# Patient Record
Sex: Male | Born: 1975 | Race: White | Hispanic: No | Marital: Single | Smoking: Never smoker
Health system: Southern US, Community
[De-identification: ages and names within clinical notes are randomized; demographics above are authoritative.]

## PROBLEM LIST (undated history)

## (undated) DIAGNOSIS — J302 Other seasonal allergic rhinitis: Secondary | ICD-10-CM

## (undated) HISTORY — PX: KNEE SURGERY: SHX244

## (undated) HISTORY — PX: OTHER SURGICAL HISTORY: SHX169

---

## 2014-01-11 ENCOUNTER — Encounter (HOSPITAL_BASED_OUTPATIENT_CLINIC_OR_DEPARTMENT_OTHER): Payer: Self-pay | Admitting: Emergency Medicine

## 2014-01-11 ENCOUNTER — Emergency Department (HOSPITAL_BASED_OUTPATIENT_CLINIC_OR_DEPARTMENT_OTHER)
Admission: EM | Admit: 2014-01-11 | Discharge: 2014-01-11 | Disposition: A | Discharge status: Home / Self Care | Payer: Self-pay | Attending: Emergency Medicine | Admitting: Emergency Medicine

## 2014-01-11 DIAGNOSIS — Z79899 Other long term (current) drug therapy: Secondary | ICD-10-CM | POA: Insufficient documentation

## 2014-01-11 DIAGNOSIS — J302 Other seasonal allergic rhinitis: Secondary | ICD-10-CM

## 2014-01-11 DIAGNOSIS — J309 Allergic rhinitis, unspecified: Secondary | ICD-10-CM | POA: Insufficient documentation

## 2014-01-11 HISTORY — DX: Other seasonal allergic rhinitis: J30.2

## 2014-01-11 MED ORDER — METHYLPREDNISOLONE ACETATE 80 MG/ML IJ SUSP
80.0000 mg | Freq: Once | INTRAMUSCULAR | Status: AC
  Administered 2014-01-11: 80 mg via INTRAMUSCULAR
  Filled 2014-01-11: qty 1

## 2014-01-11 MED ORDER — OXYMETAZOLINE HCL 0.05 % NA SOLN
1.0000 | Freq: Two times a day (BID) | NASAL | Status: DC | PRN
  Administered 2014-01-11: 1 via NASAL
  Filled 2014-01-11: qty 15

## 2014-01-11 NOTE — ED Provider Notes (Signed)
CSN: [id number]     Arrival date & time 01/11/14  [id number] History   First MD Initiated Contact with Patient 01/11/14 0541     Chief Complaint  Patient presents with  . Allergies     (Consider location/radiation/quality/duration/timing/severity/associated sxs/prior Treatment) HPI This is a 38 year old male with a history of seasonal allergies. He is here with exacerbation of his allergies for about the last 10 days. His principal complaint is nasal congestion which he says is severe enough that he "can't breathe" through his nose. He has also been having a cough for the last day and has had itching eyes. He has been taking Claritin without relief. He is requesting a shot as he had relief last year after getting a shot from his PCP. He is not sure this was a steroid.  Past Medical History  Diagnosis Date  . Seasonal allergies    Past Surgical History  Procedure Laterality Date  . Arm surgery     No family history on file. History  Substance Use Topics  . Smoking status: Never Smoker   . Smokeless tobacco: Not on file  . Alcohol Use: No    Review of Systems  All other systems reviewed and are negative.  Allergies  Review of patient's allergies indicates not on file.  Home Medications   Current Outpatient Rx  Name  Route  Sig  Dispense  Refill  . loratadine (CLARITIN) 10 MG tablet   Oral   Take 10 mg by mouth daily.          BP 127/63  Pulse 75  Temp(Src) 97.7 F (36.5 C) (Oral)  Resp 20  Ht 5\' 10"  (1.778 m)  Wt 175 lb (79.379 kg)  BMI 25.11 kg/m2  SpO2 96%  Physical Exam General: Well-developed, well-nourished male in no acute distress; appearance consistent with age of record HENT: normocephalic; atraumatic; nasal congestion Eyes: pupils equal, round and reactive to light; extraocular muscles intact Neck: supple Heart: regular rate and rhythm Lungs: clear to auscultation bilaterally Abdomen: soft; nondistended Extremities: No deformity; full range of motion;  pulses normal Neurologic: Awake, alert and oriented; motor function intact in all extremities and symmetric; no facial droop Skin: Warm and dry Psychiatric: Normal mood and affect    ED Course  Procedures (including critical care time)   MDM  We will treat with IM Depo-Medrol. The patient was also advised that an over-the-counter nasal steroid may be beneficial in the long run.    Hanley SeamenJohn L Deland Slocumb, MD 01/11/14 [phone]

## 2014-01-11 NOTE — ED Notes (Signed)
MD at bedside. 

## 2014-01-11 NOTE — ED Notes (Signed)
"  need a shot for my allergies", denies fever

## 2014-01-11 NOTE — Discharge Instructions (Signed)
Allergic Rhinitis Allergic rhinitis is when the mucous membranes in the nose respond to allergens. Allergens are particles in the air that cause your body to have an allergic reaction. This causes you to release allergic antibodies. Through a chain of events, these eventually cause you to release histamine into the blood stream. Although meant to protect the body, it is this release of histamine that causes your discomfort, such as frequent sneezing, congestion, and an itchy, runny nose.  CAUSES  Seasonal allergic rhinitis (hay fever) is caused by pollen allergens that may come from grasses, trees, and weeds. Year-round allergic rhinitis (perennial allergic rhinitis) is caused by allergens such as house dust mites, pet dander, and mold spores.  SYMPTOMS   Nasal stuffiness (congestion).  Itchy, runny nose with sneezing and tearing of the eyes. DIAGNOSIS  Your health care provider can help you determine the allergen or allergens that trigger your symptoms. If you and your health care provider are unable to determine the allergen, skin or blood testing may be used. TREATMENT  Allergic Rhinitis does not have a cure, but it can be controlled by:  Medicines and allergy shots (immunotherapy).  Avoiding the allergen. Hay fever may often be treated with antihistamines in pill or nasal spray forms. Antihistamines block the effects of histamine. There are over-the-counter medicines that may help with nasal congestion and swelling around the eyes. Check with your health care provider before taking or giving this medicine.  If avoiding the allergen or the medicine prescribed do not work, there are many new medicines your health care provider can prescribe. Stronger medicine may be used if initial measures are ineffective. Desensitizing injections can be used if medicine and avoidance does not work. Desensitization is when a patient is given ongoing shots until the body becomes less sensitive to the allergen.  Make sure you follow up with your health care provider if problems continue. HOME CARE INSTRUCTIONS It is not possible to completely avoid allergens, but you can reduce your symptoms by taking steps to limit your exposure to them. It helps to know exactly what you are allergic to so that you can avoid your specific triggers. SEEK MEDICAL CARE IF:   You have a fever.  You develop a cough that does not stop easily (persistent).  You have shortness of breath.  You start wheezing.  Symptoms interfere with normal daily activities. Document Released: 06/11/2001 Document Revised: 07/07/2013 Document Reviewed: 05/24/2013 [employer] Patient Information 2014 [employer].  

## 2015-12-21 ENCOUNTER — Emergency Department (HOSPITAL_BASED_OUTPATIENT_CLINIC_OR_DEPARTMENT_OTHER): Payer: Managed Care, Other (non HMO)

## 2015-12-21 ENCOUNTER — Encounter (HOSPITAL_BASED_OUTPATIENT_CLINIC_OR_DEPARTMENT_OTHER): Payer: Self-pay | Admitting: *Deleted

## 2015-12-21 ENCOUNTER — Emergency Department (HOSPITAL_BASED_OUTPATIENT_CLINIC_OR_DEPARTMENT_OTHER)
Admission: EM | Admit: 2015-12-21 | Discharge: 2015-12-21 | Disposition: A | Discharge status: Home / Self Care | Payer: Managed Care, Other (non HMO) | Attending: Emergency Medicine | Admitting: Emergency Medicine

## 2015-12-21 DIAGNOSIS — M79661 Pain in right lower leg: Secondary | ICD-10-CM | POA: Diagnosis not present

## 2015-12-21 DIAGNOSIS — Z8709 Personal history of other diseases of the respiratory system: Secondary | ICD-10-CM | POA: Insufficient documentation

## 2015-12-21 MED ORDER — ORPHENADRINE CITRATE ER 100 MG PO TB12: 100.0000 mg | ORAL_TABLET | Freq: Two times a day (BID) | ORAL | Status: AC

## 2015-12-21 MED ORDER — IBUPROFEN 600 MG PO TABS: 600.0000 mg | ORAL_TABLET | Freq: Four times a day (QID) | ORAL | Status: AC | PRN

## 2015-12-21 NOTE — ED Notes (Signed)
Pt transported to [facility]

## 2015-12-21 NOTE — ED Notes (Signed)
Pain in his right lower leg for a week. No injury.

## 2015-12-21 NOTE — ED Provider Notes (Signed)
CSN: [id number]     Arrival date & time 12/21/15  1454 History   First MD Initiated Contact with Patient 12/21/15 1610     Chief Complaint  Patient presents with  . Leg Pain     (Consider location/radiation/quality/duration/timing/severity/associated sxs/prior Treatment) HPI Patient has had pain in his right calf for about a week. He has not noted any swelling or redness. It is worse with stretching the calf and weightbearing. Patient had an arthroscopic right knee surgery about 3 months ago. He denies he's having any problems with the knee itself. He contacted his orthopedic doctor and he advised to get assessed for a blood clot in the leg. Patient's family history is also positive for factor V Leiden deficiency with first-degree relatives with history of DVT. Patient denies any injury. He reports he does do a lot of traveling in his car. Past Medical History  Diagnosis Date  . Seasonal allergies    Past Surgical History  Procedure Laterality Date  . Arm surgery    . Knee surgery     No family history on file. Social History  Substance Use Topics  . Smoking status: Never Smoker   . Smokeless tobacco: None  . Alcohol Use: No    Review of Systems  Constitutional: No general illness, fatigue or fever. Respiratory: No dyspnea or cough. Cardiovascular: No chest pain, syncope  Allergies  Review of patient's allergies indicates no known allergies.  Home Medications   Prior to Admission medications   Medication Sig Start Date End Date Taking? Authorizing Provider  ibuprofen (ADVIL,MOTRIN) 600 MG tablet Take 1 tablet (600 mg total) by mouth every 6 (six) hours as needed. 12/21/15   Arby Barrette, MD  loratadine (CLARITIN) 10 MG tablet Take 10 mg by mouth daily.    Historical Provider, MD   BP 122/78 mmHg  Pulse 65  Temp(Src) 98.7 F (37.1 C) (Oral)  Resp 18  Ht  (1.778 m)  Wt 180 lb (81.647 kg)  BMI 25.83 kg/m2  SpO2 95% Physical Exam  Constitutional: He is  oriented to person, place, and time. He appears well-developed and well-nourished. No distress.  HENT:  Head: Normocephalic and atraumatic.  Eyes: EOM are normal.  Cardiovascular: Normal rate, regular rhythm, normal heart sounds and intact distal pulses.   Pulmonary/Chest: Effort normal and breath sounds normal.  Musculoskeletal: Normal range of motion. He exhibits no edema.  Some reproducible tenderness to deep palpation within the main body of the gastrocnemius on the right. No appreciable swelling. Tenderness reproduced by plantar flexion. No effusion or erythema at the knee. No pain at the joint line.  Neurological: He is alert and oriented to person, place, and time. He has normal strength. He exhibits normal muscle tone. Coordination normal. GCS eye subscore is 4. GCS verbal subscore is 5. GCS motor subscore is 6.  Skin: Skin is warm, dry and intact.  Psychiatric: He has a normal mood and affect.    ED Course  Procedures (including critical care time) Labs Review Labs Reviewed - No data to display  Imaging Review US Venous Img Lower Unilateral Right  12/21/2015  CLINICAL DATA:  Right calf pain for 1 week. 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 EXAM: Right LOWER EXTREMITY VENOUS DOPPLER ULTRASOUND TECHNIQUE: Gray-scale sonography with graded compression, as well as color Doppler and duplex ultrasound were performed to evaluate the lower extremity deep venous systems from the level of the common femoral vein and including the common femoral, femoral, profunda  femoral, popliteal and calf veins including the posterior tibial, peroneal and gastrocnemius veins when visible. The superficial great saphenous vein was also interrogated. Spectral Doppler was utilized to evaluate flow at rest and with distal augmentation maneuvers in the common femoral, femoral and popliteal veins. COMPARISON:  None. FINDINGS: Contralateral Common Femoral Vein: Respiratory phasicity is normal and symmetric  with the symptomatic side. No evidence of thrombus. Normal compressibility. Common Femoral Vein: No evidence of thrombus. Normal compressibility, respiratory phasicity and response to augmentation. Saphenofemoral Junction: No evidence of thrombus. Normal compressibility and flow on color Doppler imaging. Profunda Femoral Vein: No evidence of thrombus. Normal compressibility and flow on color Doppler imaging. Femoral Vein: No evidence of thrombus. Normal compressibility, respiratory phasicity and response to augmentation. Popliteal Vein: No evidence of thrombus. Normal compressibility, respiratory phasicity and response to augmentation. Calf Veins: No evidence of thrombus. Normal compressibility and flow on color Doppler imaging. Superficial Great Saphenous Vein: No evidence of thrombus. Normal compressibility and flow on color Doppler imaging. Venous Reflux:  None. Other Findings:  None. IMPRESSION: No evidence of deep venous thrombosis. Electronically Signed   By: Burman NievesWilliam  Stevens M.D.   On: 12/21/2015 18:22   I have personally reviewed and evaluated these images and lab results as part of my medical decision-making.   EKG Interpretation None      MDM   Final diagnoses:  Calf pain, right   Patient had family history of factor V Leiden and personal travel history. He experienced calf pain over the past week. Doppler ultrasound shows no DVT present. His exam does not show erythema or swelling to suggest infectious etiology. He has good neurovascular exam and good strength exam. At this time findings are suggestive of musculoskeletal etiology. Patient will be given ibuprofen with instructions to follow up within the next week.    Arby BarretteMarcy Sema Stangler, MD 12/21/15 [phone]

## 2015-12-21 NOTE — ED Notes (Signed)
Pt remains in [facility]

## 2015-12-21 NOTE — ED Notes (Signed)
Pt c/o right mid-calf pain for 1 week.  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  Pt denies changes in lifestyle, diet or medications recently.  Pt states family history of blood clots.

## 2015-12-21 NOTE — Discharge Instructions (Signed)

## 2017-05-02 IMAGING — US US EXTREM LOW VENOUS*R*
1 series · 13 of 24 positions shown · non-contrast
Comparison: None.

CLINICAL DATA: Right calf pain for 1 week. Patient drives long
hours sitting in car for work.



[Series 1: us extrem low venous*right* · 0.08mm/px · 13 of 28 slices shown]
[im 1/28]
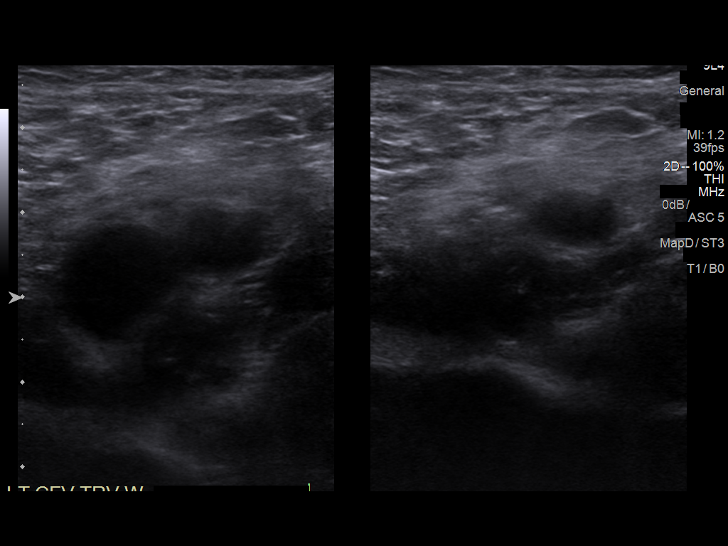
[im 3/28]
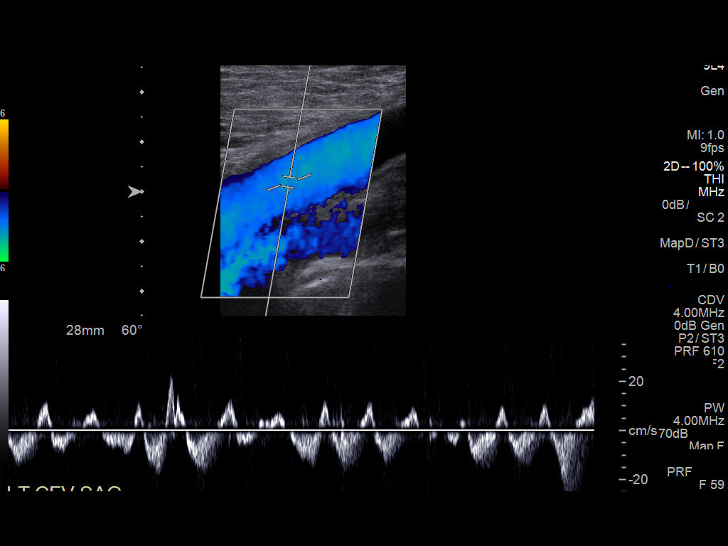
[im 5/28]
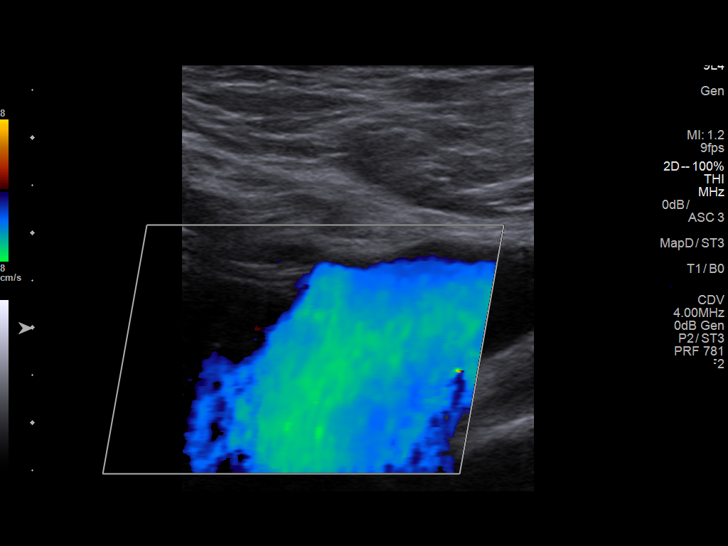
[im 8/28]
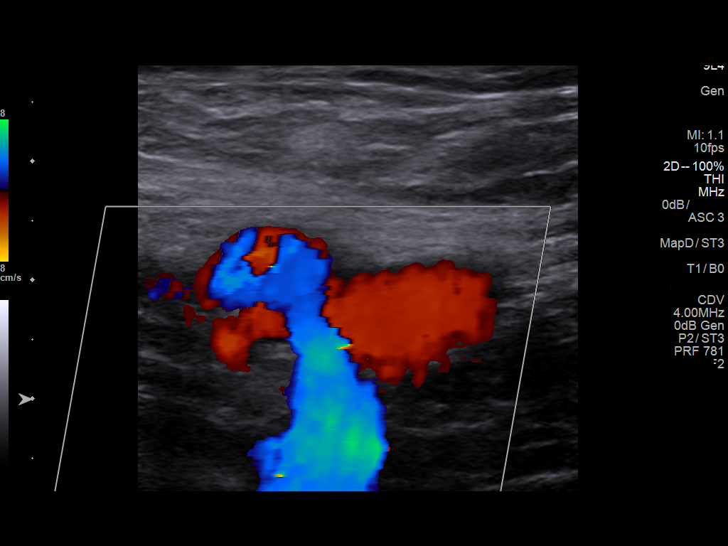
[im 10/28]
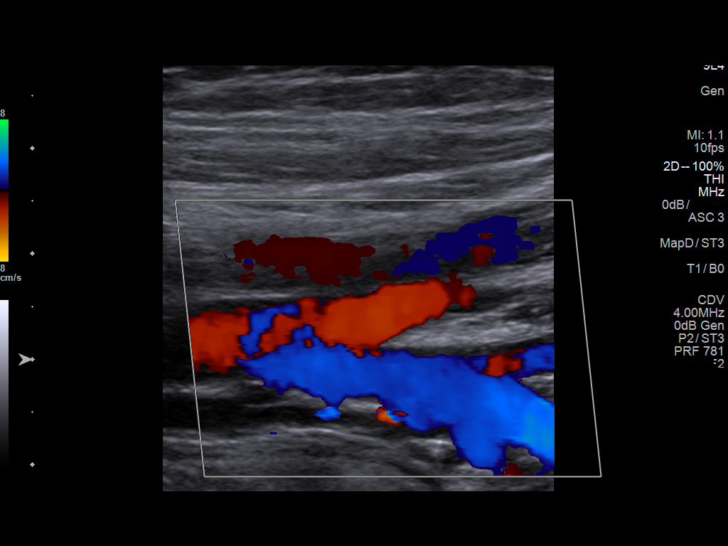
[im 12/28]
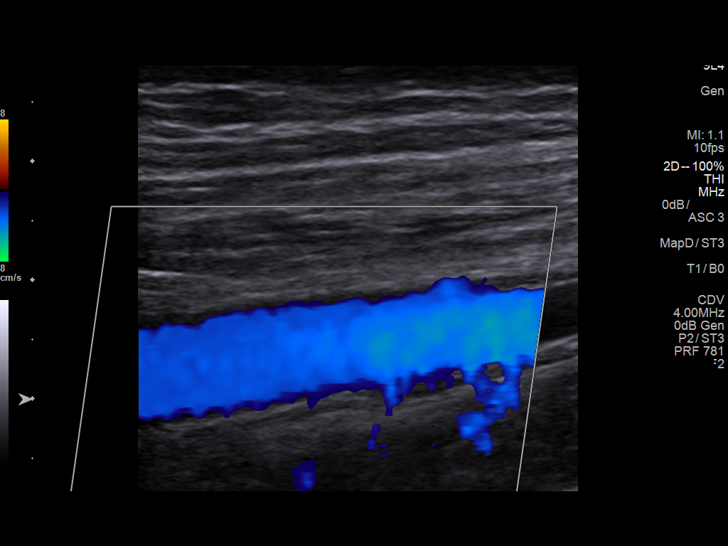
[im 15/28]
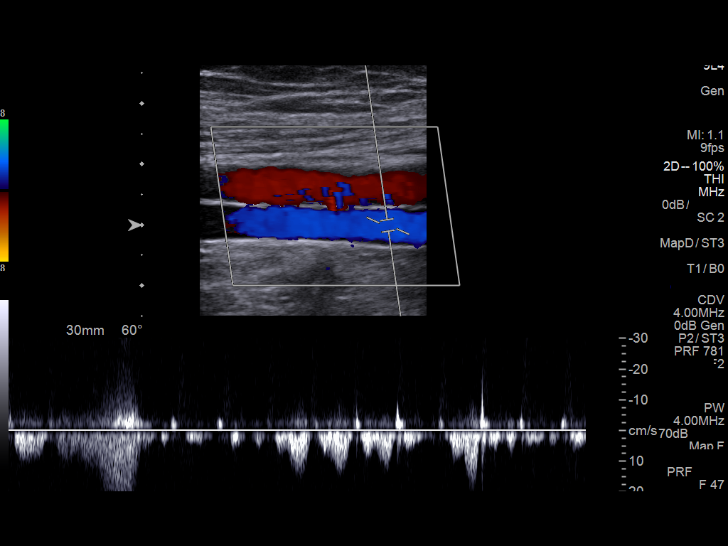
[im 16/28]
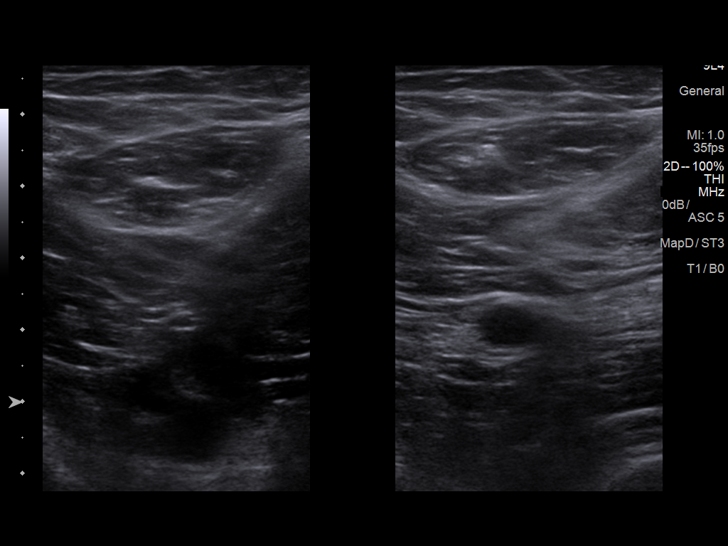
[im 18/28]
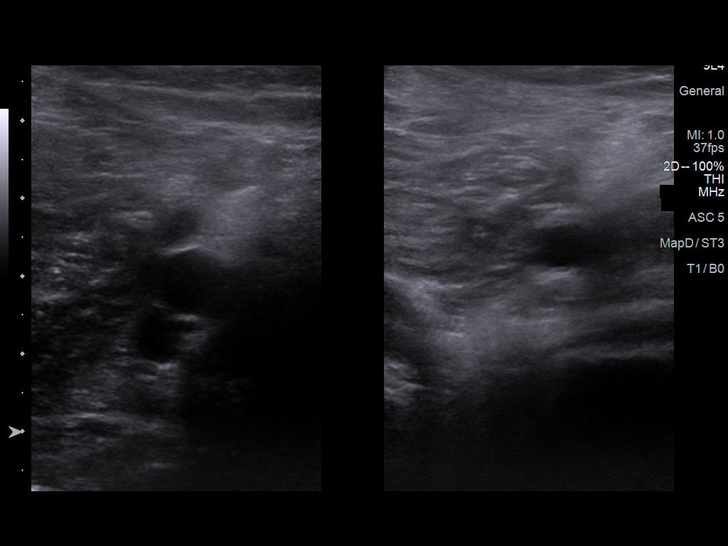
[im 20/28]
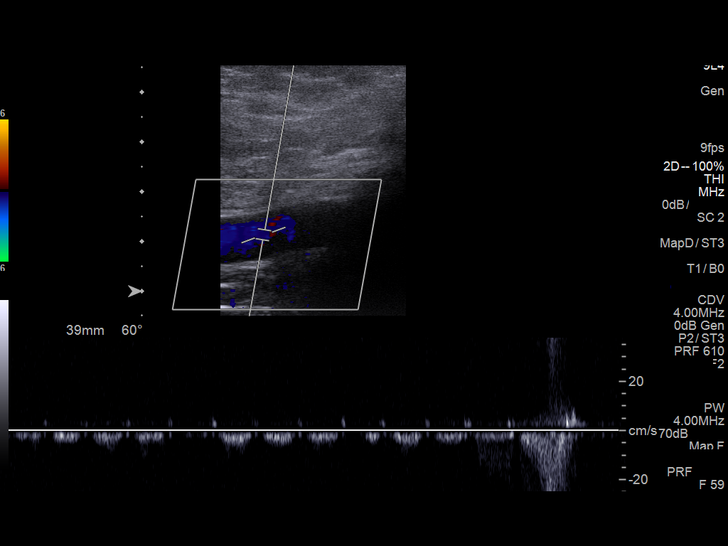
[im 23/28]
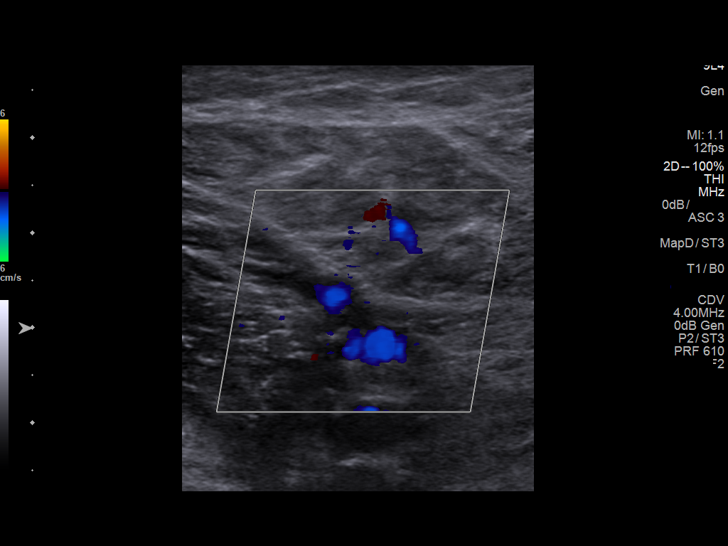
[im 25/28]
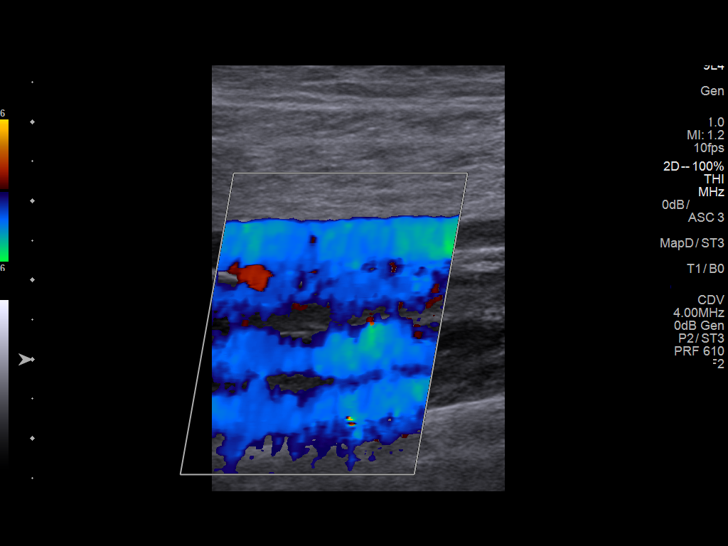
[im 28/28]
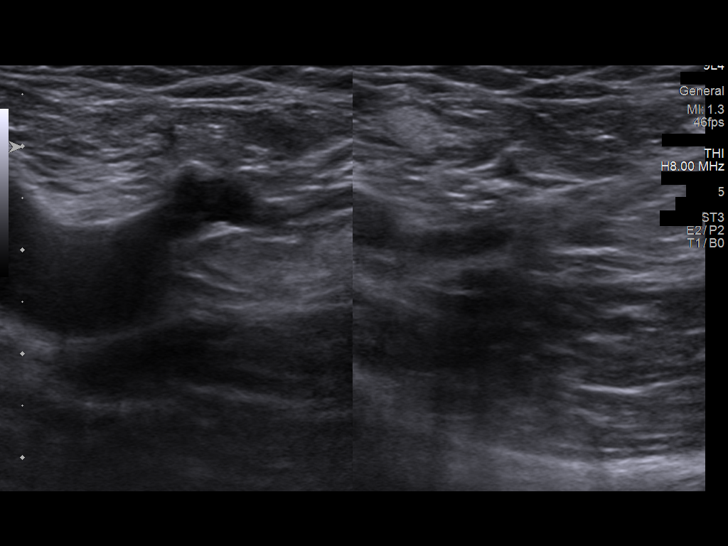

[13 of 24 positions shown; findings below may reference images not displayed]

FINDINGS: Contralateral Common Femoral Vein: Respiratory phasicity is normal
and symmetric with the symptomatic side. No evidence of thrombus.
Normal compressibility.

Common Femoral Vein: No evidence of thrombus. Normal
compressibility, respiratory phasicity and response to augmentation.

Saphenofemoral Junction: No evidence of thrombus. Normal
compressibility and flow on color Doppler imaging.

Profunda Femoral Vein: No evidence of thrombus. Normal
compressibility and flow on color Doppler imaging.

Femoral Vein: No evidence of thrombus. Normal compressibility,
respiratory phasicity and response to augmentation.

Popliteal Vein: No evidence of thrombus. Normal compressibility,
respiratory phasicity and response to augmentation.

Calf Veins: No evidence of thrombus. Normal compressibility and flow
on color Doppler imaging.

Superficial Great Saphenous Vein: No evidence of thrombus. Normal
compressibility and flow on color Doppler imaging.

Venous Reflux:  None.

Other Findings:  None.
IMPRESSION: No evidence of deep venous thrombosis.

## 2021-10-03 ENCOUNTER — Other Ambulatory Visit: Payer: Self-pay

## 2021-10-03 ENCOUNTER — Other Ambulatory Visit (HOSPITAL_COMMUNITY): Payer: Self-pay | Admitting: Physician Assistant

## 2021-10-03 ENCOUNTER — Ambulatory Visit (HOSPITAL_COMMUNITY)
Admission: RE | Admit: 2021-10-03 | Discharge: 2021-10-03 | Disposition: A | Discharge status: Home / Self Care | Payer: Self-pay | Source: Ambulatory Visit | Attending: Internal Medicine | Admitting: Internal Medicine

## 2021-10-03 DIAGNOSIS — M79661 Pain in right lower leg: Secondary | ICD-10-CM | POA: Diagnosis present

## 2021-10-03 DIAGNOSIS — M79662 Pain in left lower leg: Secondary | ICD-10-CM | POA: Insufficient documentation

## 2021-10-03 DIAGNOSIS — M79605 Pain in left leg: Secondary | ICD-10-CM | POA: Insufficient documentation

## 2021-10-03 DIAGNOSIS — M79604 Pain in right leg: Secondary | ICD-10-CM
